# Patient Record
Sex: Female | Born: 1965 | Race: White | Hispanic: No | Marital: Married | Smoking: Current every day smoker
Health system: Southern US, Community
[De-identification: ages and names within clinical notes are randomized; demographics above are authoritative.]

## PROBLEM LIST (undated history)

## (undated) DIAGNOSIS — E119 Type 2 diabetes mellitus without complications: Secondary | ICD-10-CM

## (undated) DIAGNOSIS — E785 Hyperlipidemia, unspecified: Secondary | ICD-10-CM

## (undated) DIAGNOSIS — J449 Chronic obstructive pulmonary disease, unspecified: Secondary | ICD-10-CM

## (undated) DIAGNOSIS — K219 Gastro-esophageal reflux disease without esophagitis: Secondary | ICD-10-CM

## (undated) DIAGNOSIS — G35 Multiple sclerosis: Secondary | ICD-10-CM

## (undated) DIAGNOSIS — I10 Essential (primary) hypertension: Secondary | ICD-10-CM

## (undated) HISTORY — DX: Essential (primary) hypertension: I10

## (undated) HISTORY — PX: KIDNEY STONE SURGERY: SHX686

## (undated) HISTORY — DX: Type 2 diabetes mellitus without complications: E11.9

## (undated) HISTORY — DX: Gastro-esophageal reflux disease without esophagitis: K21.9

## (undated) HISTORY — DX: Multiple sclerosis: G35

## (undated) HISTORY — DX: Hyperlipidemia, unspecified: E78.5

---

## 2001-03-18 ENCOUNTER — Ambulatory Visit (HOSPITAL_COMMUNITY): Admission: RE | Admit: 2001-03-18 | Discharge: 2001-03-18 | Payer: Self-pay | Admitting: Obstetrics and Gynecology

## 2015-05-01 ENCOUNTER — Ambulatory Visit (INDEPENDENT_AMBULATORY_CARE_PROVIDER_SITE_OTHER): Payer: 59 | Admitting: Podiatry

## 2015-05-01 ENCOUNTER — Encounter: Payer: Self-pay | Admitting: Podiatry

## 2015-05-01 VITALS — BP 121/69 | HR 93 | Resp 18

## 2015-05-01 DIAGNOSIS — B351 Tinea unguium: Secondary | ICD-10-CM

## 2015-05-01 DIAGNOSIS — M79676 Pain in unspecified toe(s): Secondary | ICD-10-CM

## 2015-05-01 NOTE — Progress Notes (Signed)
   Subjective:    Patient ID: Abigail Lang, female    DOB: Nov 15, 1965, 49 y.o.   MRN: [id number]  HPIThis patient presents to the office with two thick disfigured big toenails both feet.  She says the toenails cause pain to her toes big toes both feet.  She has tried OTC medicine unsuccessfully to eliminate her fungus nails.  She presents for evaluation and treatment.    Review of Systems  All other systems reviewed and are negative.      Objective:   Physical Exam GENERAL APPEARANCE: Alert, conversant. Appropriately groomed. No acute distress.  VASCULAR: Pedal pulses palpable at  [facility] and PT bilateral.  Capillary refill time is immediate to all digits,  Normal temperature gradient.  Digital hair growth is present bilateral  NEUROLOGIC: sensation is normal to 5.07 monofilament at 5/5 sites bilateral.  Light touch is intact bilateral, Muscle strength normal.  MUSCULOSKELETAL: acceptable muscle strength, tone and stability bilateral.  Intrinsic muscluature intact bilateral.  Rectus appearance of foot and digits noted bilateral.   DERMATOLOGIC: skin color, texture, and turgor are within normal limits.  No preulcerative lesions or ulcers  are seen, no interdigital maceration noted.  No open lesions present. . No drainage noted. NAILS  Thick disfigured discolored great toenails both feet.  Redness and swelling at proximal nail fold.         Assessment & Plan:  Onychomycosis  Hallux B/l  IE>  Discussed conservative vs. Surgical options.  She desires for nail surgery to be scheduled in one week.

## 2015-05-08 ENCOUNTER — Encounter: Payer: Self-pay | Admitting: Podiatry

## 2015-05-08 ENCOUNTER — Ambulatory Visit (INDEPENDENT_AMBULATORY_CARE_PROVIDER_SITE_OTHER): Payer: 59 | Admitting: Podiatry

## 2015-05-08 VITALS — BP 153/85 | HR 81 | Resp 18

## 2015-05-08 DIAGNOSIS — M79676 Pain in unspecified toe(s): Secondary | ICD-10-CM | POA: Diagnosis not present

## 2015-05-08 DIAGNOSIS — B351 Tinea unguium: Secondary | ICD-10-CM

## 2015-05-08 NOTE — Progress Notes (Signed)
Subjective:     Patient ID: Abigail Lang, female   DOB: 07/28/1966, 49 y.o.   MRN: [id number]  HPIThis patient presents to the office for scheduled nail surgery for the permanent removal of left hallux nail.   Review of Systems     Objective:   Physical Exam GENERAL APPEARANCE: Alert, conversant. Appropriately groomed. No acute distress.  VASCULAR: Pedal pulses palpable at  [facility] and PT bilateral.  Capillary refill time is immediate to all digits,  Normal temperature gradient.  Digital hair growth is present bilateral  NEUROLOGIC: sensation is normal to 5.07 monofilament at 5/5 sites bilateral.  Light touch is intact bilateral, Muscle strength normal.  MUSCULOSKELETAL: acceptable muscle strength, tone and stability bilateral.  Intrinsic muscluature intact bilateral.  Rectus appearance of foot and digits noted bilateral.   DERMATOLOGIC: skin color, texture, and turgor are within normal limits.  No preulcerative lesions or ulcers  are seen, no interdigital maceration noted.  No open lesions present.  . No drainage noted. NAILS  Thick disfigured discolored nails both hallux nails.  Redness at proximal nail fold left big toe.      Assessment:     Onychomycosis     Plan:     ROV  Nail surgery for permanent removal left hallux nail. Treatment options and alternatives discussed.  Recommended permanent phenol matrixectomy and patient agreed.  Left hallux nail  was prepped with alcohol and a toe block of 3cc of 2% lidocaine plain was administered in a digital toe block. .  The toe was then prepped with betadine solution .  The  nail  was then excised and matrix tissue exposed.  Phenol was then applied to the matrix tissue followed by an alcohol wash.  Antibiotic ointment and a dry sterile dressing was applied.  The patient was dispensed instructions for aftercare. RTC 1 week.  Vicodin-ES was prescribed.

## 2015-05-15 ENCOUNTER — Encounter: Payer: Self-pay | Admitting: Podiatry

## 2015-05-15 ENCOUNTER — Ambulatory Visit (INDEPENDENT_AMBULATORY_CARE_PROVIDER_SITE_OTHER): Payer: 59 | Admitting: Podiatry

## 2015-05-15 VITALS — BP 126/77 | HR 98 | Resp 16

## 2015-05-15 DIAGNOSIS — M79676 Pain in unspecified toe(s): Secondary | ICD-10-CM

## 2015-05-15 DIAGNOSIS — B351 Tinea unguium: Secondary | ICD-10-CM

## 2015-05-15 DIAGNOSIS — Z09 Encounter for follow-up examination after completed treatment for conditions other than malignant neoplasm: Secondary | ICD-10-CM

## 2015-05-15 NOTE — Progress Notes (Signed)
Subjective:     Patient ID: Abigail Lang, female   DOB: 30-Aug-1966, 49 y.o.   MRN: [id number]  HPIThis patient presents to the office following the removal of left great toenail permanently.  SWhe says she has taken advil and soaked her foot as directed.  She has returned to her normal footgear on Monday.   Review of Systems     Objective:   Physical Exam GENERAL APPEARANCE: Alert, conversant. Appropriately groomed. No acute distress.  VASCULAR: Pedal pulses palpable at  [facility] and PT bilateral.  Capillary refill time is immediate to all digits,  Normal temperature gradient.  Digital hair growth is present bilateral  NEUROLOGIC: sensation is normal to 5.07 monofilament at 5/5 sites bilateral.  Light touch is intact bilateral, Muscle strength normal.  MUSCULOSKELETAL: acceptable muscle strength, tone and stability bilateral.  Intrinsic muscluature intact bilateral.  Rectus appearance of foot and digits noted bilateral.   DERMATOLOGIC: skin color, texture, and turgor are within normal limits.  No preulcerative lesions or ulcers  are seen, no interdigital maceration noted.  No open lesions present.  . No drainage noted. NAILS  Persistant mycotic nail right hallux.  Her left hallux nail reveals healing to the nail bed. No redness or swelling around the nail surrounding nail bed.      Assessment:     S/p Nail surgery     Plan:    ROV  Continue home soaks and bandaging.  Air dry instructions were given.

## 2015-05-22 ENCOUNTER — Encounter: Payer: Self-pay | Admitting: Podiatry

## 2015-05-22 ENCOUNTER — Ambulatory Visit (INDEPENDENT_AMBULATORY_CARE_PROVIDER_SITE_OTHER): Payer: 59 | Admitting: Podiatry

## 2015-05-22 VITALS — BP 154/92 | HR 89 | Resp 17

## 2015-05-22 DIAGNOSIS — M79676 Pain in unspecified toe(s): Secondary | ICD-10-CM

## 2015-05-22 DIAGNOSIS — B351 Tinea unguium: Secondary | ICD-10-CM | POA: Diagnosis not present

## 2015-05-22 DIAGNOSIS — Z09 Encounter for follow-up examination after completed treatment for conditions other than malignant neoplasm: Secondary | ICD-10-CM | POA: Diagnosis not present

## 2015-05-22 MED ORDER — CEPHALEXIN 500 MG PO CAPS: 500.0000 mg | ORAL_CAPSULE | Freq: Three times a day (TID) | ORAL | Status: DC

## 2015-05-22 NOTE — Progress Notes (Signed)
Subjective:     Patient ID: Abigail Lang, female   DOB: 07-Oct-1965, 49 y.o.   MRN: [id number]  HPIPatient returns to office for scheduled nail surgery for permanent removal right great toenail.  Her left great toenail is healing well and she wore her shoes in no pain.  She desires to proceed with her surgery right big toe.   Review of Systems     Objective:   Physical Exam GENERAL APPEARANCE: Alert, conversant. Appropriately groomed. No acute distress.  VASCULAR: Pedal pulses palpable at  [facility] and PT bilateral.  Capillary refill time is immediate to all digits,  Normal temperature gradient.  Digital hair growth is present bilateral  NEUROLOGIC: sensation is normal to 5.07 monofilament at 5/5 sites bilateral.  Light touch is intact bilateral, Muscle strength normal.  MUSCULOSKELETAL: acceptable muscle strength, tone and stability bilateral.  Intrinsic muscluature intact bilateral.  Rectus appearance of foot and digits noted bilateral.   DERMATOLOGIC: skin color, texture, and turgor are within normal limits.  No preulcerative lesions or ulcers  are seen, no interdigital maceration noted.  No open lesions present. . No drainage noted. NAILS  Her left hallux has moist covering which appears to be related to her neosporin usage.  Her right hallux nail is thick disfigured and discolored.     Assessment:     Onychomycosis Hallux B/L     Plan:     Nail surgery right hallux. Treatment options and alternatives discussed.  Recommended permanent phenol matrixectomy and patient agreed.  Right hallux  was prepped with alcohol and a toe block of 3cc of 2% lidocaine plain was administered in a digital toe block. .  The toe was then prepped with betadine solution .  The complete  nail  was then excised and matrix tissue exposed.  Phenol was then applied to the matrix tissue followed by an alcohol wash.  Antibiotic ointment and a dry sterile dressing was applied.  The patient was dispensed instructions for  aftercare.   RTC 1 week. Cephalexin 500 mg sent to pharmacy.  Vicodin-ES was prescribed # 20.

## 2015-05-29 ENCOUNTER — Encounter: Payer: Self-pay | Admitting: Podiatry

## 2015-05-29 ENCOUNTER — Ambulatory Visit (INDEPENDENT_AMBULATORY_CARE_PROVIDER_SITE_OTHER): Payer: 59 | Admitting: Podiatry

## 2015-05-29 DIAGNOSIS — Z09 Encounter for follow-up examination after completed treatment for conditions other than malignant neoplasm: Secondary | ICD-10-CM

## 2015-05-29 NOTE — Progress Notes (Signed)
Subjective:     Patient ID: Abigail Lang, female   DOB: [birth year], 49 y.o.   MRN: [id number]  HPIThis patient presents to the office following nail removal both hallux.  Her left big toenail was removed three weeks ago.  It is healing with no drainage but has not completely healed.  Her right big toenail area has drainage.  She is soaking and bandaging her right big toenail.  She is taking antibiotics.   Review of Systems     Objective:   Physical Exam GENERAL APPEARANCE: Alert, conversant. Appropriately groomed. No acute distress.  VASCULAR: Pedal pulses palpable at  [facility] and PT bilateral.  Capillary refill time is immediate to all digits,  Normal temperature gradient.  Digital hair growth is present bilateral  NEUROLOGIC: sensation is normal to 5.07 monofilament at 5/5 sites bilateral.  Light touch is intact bilateral, Muscle strength normal.  MUSCULOSKELETAL: acceptable muscle strength, tone and stability bilateral.  Intrinsic muscluature intact bilateral.  Rectus appearance of foot and digits noted bilateral.   DERMATOLOGIC: skin color, texture, and turgor are within normal limits.  No preulcerative lesions or ulcers  are seen, no interdigital maceration noted.  No open lesions present.  Digital nails are asymptomatic. No drainage noted. NAILS  Her left big toe is healing with callus cover developing with no drainage or infection.  Her right big toe is healing at nail bed but redness and swelling present at proximal nail fold.     Assessment:     S/p nail surgery     Plan:     ROV  Home instructions given.  RTC 3 weeks.

## 2015-06-19 ENCOUNTER — Ambulatory Visit: Payer: 59 | Admitting: Sports Medicine

## 2020-09-03 ENCOUNTER — Other Ambulatory Visit: Payer: Self-pay | Admitting: Family Medicine

## 2020-09-03 ENCOUNTER — Other Ambulatory Visit (HOSPITAL_COMMUNITY)
Admission: RE | Admit: 2020-09-03 | Discharge: 2020-09-03 | Disposition: A | Discharge status: Home / Self Care | Payer: 59 | Source: Ambulatory Visit | Attending: Family Medicine | Admitting: Family Medicine

## 2020-09-03 DIAGNOSIS — Z124 Encounter for screening for malignant neoplasm of cervix: Secondary | ICD-10-CM | POA: Diagnosis present

## 2020-09-03 DIAGNOSIS — R1012 Left upper quadrant pain: Secondary | ICD-10-CM

## 2020-09-09 LAB — CYTOLOGY - PAP
Comment: NEGATIVE
Diagnosis: NEGATIVE
High risk HPV: NEGATIVE

## 2020-09-23 ENCOUNTER — Other Ambulatory Visit: Payer: Self-pay | Admitting: Family Medicine

## 2020-09-23 DIAGNOSIS — R1012 Left upper quadrant pain: Secondary | ICD-10-CM

## 2020-10-15 ENCOUNTER — Other Ambulatory Visit: Payer: 59

## 2020-10-22 ENCOUNTER — Ambulatory Visit
Admission: RE | Admit: 2020-10-22 | Discharge: 2020-10-22 | Disposition: A | Discharge status: Home / Self Care | Payer: 59 | Source: Ambulatory Visit | Attending: Family Medicine | Admitting: Family Medicine

## 2020-10-22 ENCOUNTER — Other Ambulatory Visit: Payer: Self-pay | Admitting: Family Medicine

## 2020-10-22 DIAGNOSIS — R059 Cough, unspecified: Secondary | ICD-10-CM

## 2020-10-29 ENCOUNTER — Encounter: Payer: Self-pay | Admitting: Dietician

## 2020-10-29 ENCOUNTER — Other Ambulatory Visit: Payer: Self-pay

## 2020-10-29 ENCOUNTER — Encounter: Payer: 59 | Attending: Family Medicine | Admitting: Dietician

## 2020-10-29 DIAGNOSIS — E119 Type 2 diabetes mellitus without complications: Secondary | ICD-10-CM | POA: Insufficient documentation

## 2020-10-29 NOTE — Patient Instructions (Addendum)
Consider a vitamin D supplement and a B complex vitamin Consider the Calorie [employer] P[employer] app to help with informed decisions.  Aim for 3 Carb Choices per meal (45 grams) +/- 1 either way  Aim for 0-1 Carbs per snack if hungry  Include protein in moderation with your meals and snacks Consider reading food labels for Total Carbohydrate of foods Consider  increasing your activity level by walking for 30 minutes daily as tolerated Consider checking BG at alternate times per day  Continue taking medication as directed by MD

## 2020-10-29 NOTE — Progress Notes (Signed)
Diabetes Self-Management Education  Visit Type: First/Initial  Appt. Start Time: 1000  Appt. End Time: 1130  10/30/2020  [name], identified by name and date of birth, is a 55 y.o. female with a diagnosis of Diabetes: Type 2.   ASSESSMENT Patient is here today alone.  She has newly diagnosed type 2 diabetes.  She was also referred for HTN and HLD.    Other history includes:  MS, GERD, kidney stones, and GDM.  She is having nausea, abdominal pain, and bloating.  This is being worked up. Labe:  A1C 8.2, cholesterol 208, triglycerides 178, HDL 42, LDL 135 09/2020 Sleep:  6.5-7 hours of interrupted sleep  (due to frequent urination) Medications include:  Metformin, 200 mg caffeine bid  Patient lives with her mother to help care for her (she has lung cancer, severe COPD, and falls frequently). Her boyfried works 3rd shift and stays with her mom during the day.  Her mom likes a lot of sweets but this is not tempting to patient.  [place] likes increased bread (changed to [employer]), ice cream (changed to frozen yogurt), and cereal and whole milk (changed to honey nut cheerios from sugar smacks etc).  She loves salt. She used to skip breakfast and lunch and has now added lunch. She recently ordered 3 cook books for cooking with diabetes. Patient smokes and she wants to quit smoking but thinks that she cannot currently due to increased stress. Patient does the shopping and cooking.  Her mom likes to get take out frequently. 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 She is currently not exercising although able.  She is thinking of joining a gym or finding another option to walk during lunch.  Height _0  (1.626 m), weight 221 lb (100.2 kg). Body mass index is 37.93 kg/m.   Diabetes Self-Management Education - 10/29/20 1019      Visit Information   Visit Type First/Initial      Initial Visit   Diabetes Type Type 2    Are you currently  following a meal plan? No    Are you taking your medications as prescribed? Yes    Date Diagnosed 08/2020      Health Coping   How would you rate your overall health? Fair      Psychosocial Assessment   Patient Belief/Attitude about Diabetes Motivated to manage diabetes    Self-care barriers None    Self-management support Doctor's office    Other persons present Patient    Patient Concerns Nutrition/Meal planning;Glycemic Control;Weight Control    Special Needs None    Preferred Learning Style No preference indicated    Learning Readiness Ready    How often do you need to have someone help you when you read instructions, pamphlets, or other written materials from your doctor or pharmacy? 1 - Never    What is the last grade level you completed in school? some college      Pre-Education Assessment   Patient understands the diabetes disease and treatment process. Needs Instruction    Patient understands incorporating nutritional management into lifestyle. Needs Instruction    Patient undertands incorporating physical activity into lifestyle. Needs Instruction    Patient understands using medications safely. Needs Instruction    Patient understands monitoring blood glucose, interpreting and using results Needs Instruction    Patient understands prevention, detection, and treatment of acute complications. Needs Instruction    Patient understands prevention, detection, and  treatment of chronic complications. Needs Instruction    Patient understands how to develop strategies to address psychosocial issues. Needs Instruction    Patient understands how to develop strategies to promote health/change behavior. Needs Instruction      Complications   Last HgB A1C per patient/outside source 8.2 %   09/03/2021   How often do you check your blood sugar? 3-4 times / week    Fasting Blood glucose range (mg/dL) 70-129;130-179    Postprandial Blood glucose range (mg/dL) 130-179;180-200    Number of  hypoglycemic episodes per month 0    Number of hyperglycemic episodes per week 2    Can you tell when your blood sugar is high? Yes    What do you do if your blood sugar is high? drinks a lot of water    Have you had a dilated eye exam in the past 12 months? No    Have you had a dental exam in the past 12 months? No    Are you checking your feet? Yes    How many days per week are you checking your feet? 7      Dietary Intake   Breakfast nothing (due to nausea)    Snack (morning) activia, occasional fruit    Lunch Healthy Choice Meal OR other healthy frozen meal, occasional fruit    Snack (afternoon) fruit    Dinner ribs, broccoli, salad OR 5 wings, pizza OR [employer] chicken, potatoes, mac and cheese, slaw    Snack (evening) frozen yogurt    Beverage(s) water, crystal lite, occasional coffee with splenda, cream      Exercise   Exercise Type ADL's    How many days per week to you exercise? 0    How many minutes per day do you exercise? 0    Total minutes per week of exercise 0      Patient Education   Previous Diabetes Education No    Disease state  Definition of diabetes, type 1 and 2, and the diagnosis of diabetes;Factors that contribute to the development of diabetes    Nutrition management  Role of diet in the treatment of diabetes and the relationship between the three main macronutrients and blood glucose level;Food label reading, portion sizes and measuring food.;Carbohydrate counting;Information on hints to eating out and maintain blood glucose control.;Meal options for control of blood glucose level and chronic complications.    Physical activity and exercise  Role of exercise on diabetes management, blood pressure control and cardiac health.    Medications Reviewed patients medication for diabetes, action, purpose, timing of dose and side effects.    Monitoring Purpose and frequency of SMBG.;Identified appropriate SMBG and/or A1C goals.;Daily foot exams;Yearly dilated eye exam     Acute complications Taught treatment of hypoglycemia - the 15 rule.;Discussed and identified patients' treatment of hyperglycemia.    Chronic complications Relationship between chronic complications and blood glucose control;Dental care;Retinopathy and reason for yearly dilated eye exams;Assessed and discussed foot care and prevention of foot problems    Personal strategies to promote health Lifestyle issues that need to be addressed for better diabetes care      Individualized Goals (developed by patient)   Nutrition General guidelines for healthy choices and portions discussed    Physical Activity Exercise 5-7 days per week;30 minutes per day    Medications take my medication as prescribed    Monitoring  test my blood glucose as discussed    Reducing Risk examine blood glucose patterns;do foot checks  daily;increase portions of healthy fats    Health Coping discuss diabetes with (comment)   MD, RD, CDCES     Post-Education Assessment   Patient understands the diabetes disease and treatment process. Demonstrates understanding / competency    Patient understands incorporating nutritional management into lifestyle. Needs Review    Patient undertands incorporating physical activity into lifestyle. Needs Review    Patient understands using medications safely. Demonstrates understanding / competency    Patient understands monitoring blood glucose, interpreting and using results Needs Review    Patient understands prevention, detection, and treatment of acute complications. Demonstrates understanding / competency    Patient understands prevention, detection, and treatment of chronic complications. Demonstrates understanding / competency    Patient understands how to develop strategies to address psychosocial issues. Needs Review    Patient understands how to develop strategies to promote health/change behavior. Needs Review      Outcomes   Expected Outcomes Demonstrated interest in learning. Expect  positive outcomes    Future DMSE 2 months    Program Status Not Completed           Individualized Plan for Diabetes Self-Management Training:   Learning Objective:  Patient will have a greater understanding of diabetes self-management. Patient education plan is to attend individual and/or group sessions per assessed needs and concerns.   Plan:   Patient Instructions  Consider a vitamin D supplement and a B complex vitamin Consider the Calorie [employer] Pace app to help with informed decisions.  Aim for 3 Carb Choices per meal (45 grams) +/- 1 either way  Aim for 0-1 Carbs per snack if hungry  Include protein in moderation with your meals and snacks Consider reading food labels for Total Carbohydrate of foods Consider  increasing your activity level by walking for 30 minutes daily as tolerated Consider checking BG at alternate times per day  Continue taking medication as directed by MD      Expected Outcomes:  Demonstrated interest in learning. Expect positive outcomes  Education material provided: ADA - How to Thrive: A Guide for Your Journey with Diabetes, Food label handouts, Meal plan card, Snack sheet and Diabetes Resources. Dining out tips  If problems or questions, patient to contact team via:  Phone  Future DSME appointment: 2 months

## 2020-10-31 ENCOUNTER — Ambulatory Visit: Payer: 59 | Admitting: Dietician

## 2020-11-04 ENCOUNTER — Ambulatory Visit
Admission: RE | Admit: 2020-11-04 | Discharge: 2020-11-04 | Disposition: A | Discharge status: Home / Self Care | Payer: 59 | Source: Ambulatory Visit | Attending: Family Medicine | Admitting: Family Medicine

## 2020-11-04 ENCOUNTER — Other Ambulatory Visit: Payer: Self-pay

## 2020-11-04 DIAGNOSIS — R1012 Left upper quadrant pain: Secondary | ICD-10-CM

## 2020-11-04 MED ORDER — IOPAMIDOL (ISOVUE-300) INJECTION 61%
100.0000 mL | Freq: Once | INTRAVENOUS | Status: AC | PRN
  Administered 2020-11-04: 100 mL via INTRAVENOUS

## 2020-11-19 ENCOUNTER — Telehealth: Payer: Self-pay | Admitting: *Deleted

## 2020-11-19 ENCOUNTER — Encounter: Payer: Self-pay | Admitting: Neurology

## 2020-11-19 ENCOUNTER — Ambulatory Visit: Payer: 59 | Admitting: Neurology

## 2020-11-19 VITALS — BP 133/86 | HR 83 | Ht 64.0 in | Wt 216.0 lb

## 2020-11-19 DIAGNOSIS — R5383 Other fatigue: Secondary | ICD-10-CM

## 2020-11-19 DIAGNOSIS — R3915 Urgency of urination: Secondary | ICD-10-CM | POA: Diagnosis not present

## 2020-11-19 DIAGNOSIS — G35 Multiple sclerosis: Secondary | ICD-10-CM

## 2020-11-19 DIAGNOSIS — H469 Unspecified optic neuritis: Secondary | ICD-10-CM

## 2020-11-19 DIAGNOSIS — R269 Unspecified abnormalities of gait and mobility: Secondary | ICD-10-CM

## 2020-11-19 MED ORDER — MODAFINIL 200 MG PO TABS: 200.0000 mg | ORAL_TABLET | Freq: Every day | ORAL | 5 refills | Status: DC

## 2020-11-19 MED ORDER — TOLTERODINE TARTRATE ER 4 MG PO CP24: 4.0000 mg | ORAL_CAPSULE | Freq: Every day | ORAL | 11 refills | Status: DC

## 2020-11-19 NOTE — Progress Notes (Addendum)
[facility]  PATIENT: Abigail Lang DOB: November 08, 1965  REFERRING DOCTOR OR PCP: Harlan Stains MD SOURCE: Patient, notes from Dr. Dema Severin, note from Dr. Vernona Rieger, imaging and EMG and lab results, MRI images personally reviewed  _________________________________   HISTORICAL  CHIEF COMPLAINT:  Chief Complaint  Patient presents with   New Patient (Initial Visit)    RM 13, alone. Paper referral from Sheliah Hatch for MS. Previously treated in [facility]. Dx w/ MS at age 42 by Dr. Evette Doffing. Transitioned to Dr. Erling Cruz. Has not seen neurologist in about 7 yr. Tried/failed: Tysabri (ended up being JCV +), Betaseron (made her weak). Avonex: "made leg muscles feel like they were ripping". Takes Vavarin tabs twice daily (caffeine pills). Last MRI >5 yr ago.    HISTORY OF PRESENT ILLNESS:  I had the pleasure of seeing your patient, Abigail Lang, at [facility] for neurologic consultation regarding her multiple sclerosis.  She is a 55 year old woman who was diagnosed with MS at age 59 (35) after presenting with numbness in her right face,   Over time, symptoms improved.   She had an MRI and a lumbar puncture at the time and was diagnosed with MS (Dr. Johnnye Sima and then Dr. Erling Cruz).   She was not placed on treatment.   She had right optic neuritis 2010 or 2011.   She had some symptoms off/on over the next 10 years, but none enough to seek medical attention.   She was treated with IV Solu-medrol and she got full return of her vision.    She saw Dr. Vernona Rieger and was placed on Avonex.   She had severe myalgias and stopped after a year.   She then tried IV Tysabri and was on that x 2-3 years.   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    She then tried Betaseron a short while in 2013 (expected to be well but had tolerability issues and then lost her insurance.   She last saw Dr. Vernona Rieger around that time.    In general, she has done well  between 2014 and 2022 and just had some mild transitory symptoms.    Her last MRI was in 2012.  Currently, she notes her balance is slightly off but could walk > 1 mile without stopping.  The right foot will drag when she goes longer distance.   She deneies any significant numbness.   She has urge incontinence.    She has nocturia several times a night.     She feels vision is mildly off.  She notes reduced color vision OS.   She has a lot of fatigue.   She takes caffeine pills with transient benefit.   She was once on Provigil with benefit but has not used it > 8 years.    She notes some depression but not anxiety.   She has increased stres with her mom having dementia and cancer and just breakingup with a long term boyfriend.   She notes cognitive issues such as word finding difficulty and mild reduced memory and decreased focus/attention.       Data reviewed today MRI of the brain 05/26/2011 shows T2/FLAIR hyperintense foci in the periventricular, juxtacortical and deep white matter of the hemispheres.  Additionally there is a focus in the left middle cerebellar peduncle.  None of the foci enhance after contrast.  There is a 1.2 cm dural based enhancing focus most consistent with a falx meningioma.  No  changes compared to the 2010 MRI.  MRI of the brain 05/16/2009 shows T2/FLAIR hyperintense foci in the periventricular, juxtacortical and deep white matter of both hemispheres.  There is a focus in the left middle cerebellar peduncle.  The foci do not enhance after contrast.  There is a 1.2 mm homogenously enhancing dural based focus consistent with a meningioma of the falx.  EMG/NCV 03/13/2010 showed severe right and mild left carpal tunnel syndromes  REVIEW OF SYSTEMS: Constitutional: No fevers, chills, sweats, or change in appetite.  Fatigue Eyes: No visual changes, double vision, eye pain.  History of optic neuritis Ear, nose and throat: No hearing loss, ear pain, nasal congestion, sore  throat Cardiovascular: No chest pain, palpitations Respiratory:  No shortness of breath at rest or with exertion.   No wheezes GastrointestinaI: No nausea, vomiting, diarrhea, abdominal pain, fecal incontinence Genitourinary:   urinary urgency. Musculoskeletal:  No neck pain, back pain Integumentary: No rash, pruritus, skin lesions Neurological: as above Psychiatric: She has some depression and anxiety Endocrine: No palpitations, diaphoresis, change in appetite, change in weigh or increased thirst Hematologic/Lymphatic:  No anemia, purpura, petechiae. Allergic/Immunologic: No itchy/runny eyes, nasal congestion, recent allergic reactions, rashes  ALLERGIES: No Known Allergies  HOME MEDICATIONS:  Current Outpatient Medications:    caffeine 200 MG TABS tablet, Take 200 mg by mouth 2 (two) times daily as needed., Disp: , Rfl:    losartan (COZAAR) 50 MG tablet, Take 50 mg by mouth daily., Disp: , Rfl:    metFORMIN (GLUCOPHAGE) 1000 MG tablet, Take 2,000 mg by mouth every evening., Disp: , Rfl:    omeprazole (PRILOSEC) 40 MG capsule, Take 40 mg by mouth daily., Disp: , Rfl:   PAST MEDICAL HISTORY: Past Medical History:  Diagnosis Date   Diabetes mellitus without complication (HCC)    GERD (gastroesophageal reflux disease)    Hyperlipidemia    Hypertension    MS (multiple sclerosis) ([place])     PAST SURGICAL HISTORY: Past Surgical History:  Procedure Laterality Date   CHOLECYSTECTOMY     LAPAROSCOPIC OVARIAN     TUBAL LIGATION      FAMILY HISTORY: Family History  Problem Relation Age of Onset   COPD Mother    Lung cancer Mother    Hypertension Mother    High Cholesterol Mother    Heart attack Father    High Cholesterol Father    Hypertension Father    Diabetes Father     SOCIAL HISTORY:  Social History   Socioeconomic History   Marital status: Married    Spouse name: Not on file   Number of children: Not on file   Years of education: Not on file   Highest  education level: Not on file  Occupational History   Not on file  Tobacco Use   Smoking status: Current Every Day Smoker    Packs/day: 1.00   Smokeless tobacco: Never Used  Substance and Sexual Activity   Alcohol use: Not Currently    Alcohol/week: 0.0 standard drinks   Drug use: Never   Sexual activity: Not on file  Other Topics Concern   Not on file  Social History Narrative   Right handed   Lives w/ mother   Caffeine JHE:RDEYCX, soda once a week   Social Determinants of Health   Financial Resource Strain: Not on file  Food Insecurity: Not on file  Transportation Needs: Not on file  Physical Activity: Not on file  Stress: Not on file  Social Connections: Not on  file  Intimate Partner Violence: Not on file     PHYSICAL EXAM  Vitals:   11/19/20 1311  BP: 133/86  Pulse: 83  Weight: 216 lb (98 kg)  Height: 5\' 4"  (1.626 m)    Body mass index is 37.08 kg/m.   Hearing Screening   125Hz  250Hz  500Hz  1000Hz  2000Hz  3000Hz  4000Hz  6000Hz  8000Hz   Right ear:           Left ear:             Visual Acuity Screening   Right eye Left eye Both eyes  Without correction: 20/100 20/100 20/70  With correction:       General: The patient is well-developed and well-nourished and in no acute distress  HEENT:  Head is Robertson/AT.  Sclera are anicteric.  Funduscopic exam shows slight left optic nerve pallor  Neck: No carotid bruits are noted.  The neck is nontender.  Cardiovascular: The heart has a regular rate and rhythm with a normal S1 and S2. There were no murmurs, gallops or rubs.    Skin: Extremities are without rash or  edema.  Musculoskeletal:  Back is nontender  Neurologic Exam  Mental status: The patient is alert and oriented x 3 at the time of the examination. The patient has apparent normal recent and remote memory, with an apparently normal attention span and concentration ability.   Speech is normal.  Cranial nerves: Extraocular movements are full.  1+ left APD.   Visual fields are full.  Facial symmetry is present. There is good facial sensation to soft touch bilaterally.Facial strength is normal.  Trapezius and sternocleidomastoid strength is normal. No dysarthria is noted.  The tongue is midline, and the patient has symmetric elevation of the soft palate. No obvious hearing deficits are noted.  Motor:  Muscle bulk is normal.   Tone is normal. Strength is  5 / 5 in all 4 extremities except 4+/5 right APB.   Sensory: Sensory testing is intact to pinprick, soft touch and vibration sensation in all 4 extremities.  She did not have a Tinel's sign.  Coordination: Cerebellar testing reveals good finger-nose-finger and heel-to-shin bilaterally.  Gait and station: Station is normal.   Gait is normal. Tandem gait is mildly wide.  . Romberg is negative.   Reflexes: Deep tendon reflexes are symmetric and normal bilaterally.   Plantar responses are flexor.      ASSESSMENT AND PLAN  Multiple sclerosis ([place]) - Plan: MR BRAIN W WO CONTRAST, MR CERVICAL SPINE W WO CONTRAST  Optic neuritis  Other fatigue  Urinary urgency  Gait disturbance - Plan: MR BRAIN W WO CONTRAST, MR CERVICAL SPINE W WO CONTRAST   In summary, Abigail Lang is a 55 year old woman who was diagnosed with MS about 35 years ago.  Her last definite exacerbation was around 2010-2011 when she had optic neuritis..  She has not been on any disease modifying therapy since 2013.  MRI was stable between 2010 and 2012 but she has not had any further imaging studies.  She most likely has a milder than typical multiple sclerosis.  We discussed that we need to obtain another MRI of the brain to compare with the one from 2012.  We will also check MRI of the cervical spine.  Most likely she has had some progression.  If we see progression I would definitely encourage her to begin a disease modifying therapy.  Mavenclad might be a good option as it would improve compliance..  Her main symptoms  currently are  fatigue and bladder dysfunction.  She did very well with Provigil in the past so I will reinitiate modafinil 200 mg p.o. every morning.  Additionally I will start Detrol LA 4 mg.  She also has carpal tunnel on the right.  She feels symptoms have stabilized and she prefers not to do any surgery at this time.  She will return to see me in 3 months but we will let her know the results of the MRI and bring her in sooner if we need to check blood work to begin a disease modifying therapy.  Thank you for asking me to see Abigail Lang.  Please let me know if I can be of further assistance with her or other patients in the future.   Jolie Lang A. Felecia Shelling, MD, [facility] 04/21/7194, 9:74 PM Certified in Neurology, Clinical Neurophysiology, Sleep Medicine and Neuroimaging  [facility] [address] [place], [place] [address] [phone]

## 2020-11-19 NOTE — Telephone Encounter (Signed)
Tried initiating PA modafinil on CMM. Key:[id number]. Unable to locate pt.  LVM for pt to call and verify info on file before proceeding further w/ PA

## 2020-11-20 ENCOUNTER — Telehealth: Payer: Self-pay | Admitting: Neurology

## 2020-11-20 NOTE — Telephone Encounter (Signed)
11/20/20 LVM for pt to call back to schedule [facility] 11/20/20 [employer] auth: [id number] (exp. 11/20/20 to 01/04/21) EE

## 2020-11-20 NOTE — Telephone Encounter (Signed)
Called pt and informed her modafinil approved, she can now pick up rx from pharmacy. She verbalized understanding.

## 2020-11-20 NOTE — Telephone Encounter (Signed)
I tried submitting a new PA but still could not locate pt. I called pt. Confirmed name spelled correct/DOB correct. Confirmed address. She thought it may be associated with [address] [place], [place] [address]. I tried this and still unable to locate her by name/DOB. She is going to contact her insurance and call us back w/ update.

## 2020-11-20 NOTE — Telephone Encounter (Signed)
Late entry from 11/19/20: Pt called back and spoke with Jael in phone room. Reported insurance has old address on file: [address] [place], [place] [address]. She has to get this updated w/ them.

## 2020-11-20 NOTE — Telephone Encounter (Addendum)
Per Jael, pt called back and provided the following phone# to call to do PA: [phone]. Last 4 of social per pt: [phone].  Called and spoke with [facility]. ref# for call: [id number]. She states I had the incorrect department. I need to call: [phone] (pharmacy team). Called and spoke with Kennyth Lose. Provided PA info over the phone and received approval info: approved 11-21-20-2021-11-21. [id number].   ICD 10 codes: R53.83 (fatigue), G35 (MS)

## 2020-11-26 ENCOUNTER — Ambulatory Visit: Payer: 59 | Admitting: Neurology

## 2020-11-27 ENCOUNTER — Other Ambulatory Visit: Payer: Self-pay | Admitting: *Deleted

## 2020-11-27 MED ORDER — OXYBUTYNIN CHLORIDE ER 10 MG PO TB24: 10.0000 mg | ORAL_TABLET | Freq: Every day | ORAL | 11 refills | Status: AC

## 2020-11-27 NOTE — Progress Notes (Signed)
Called pt. Made her aware pharmacy reached out to let us know tolerodine 4mg  24hr cap not covered by insurance. Alternatives insurance will cover: oxybutynin, oxybutynin ER or Toviaz. Per MD, he would like to switch her to Oxybutynin ER 10mg  daily #30, 11 refills. Pt agreeable to plan. I e-scribed rx to pharmacy.

## 2020-12-24 ENCOUNTER — Ambulatory Visit: Payer: 59 | Admitting: Dietician

## 2021-03-11 ENCOUNTER — Ambulatory Visit: Payer: 59 | Admitting: Neurology

## 2021-06-04 ENCOUNTER — Other Ambulatory Visit: Payer: Self-pay | Admitting: Neurology

## 2021-06-05 ENCOUNTER — Other Ambulatory Visit: Payer: Self-pay | Admitting: Neurology

## 2021-06-05 NOTE — Telephone Encounter (Signed)
Pt called requesting refill for modafinil (PROVIGIL) 200 MG tablet. [place] [phone].

## 2021-06-09 ENCOUNTER — Telehealth: Payer: Self-pay | Admitting: Neurology

## 2021-06-09 MED ORDER — MODAFINIL 200 MG PO TABS
200.0000 mg | ORAL_TABLET | Freq: Every day | ORAL | 5 refills | Status: AC
Start: 2021-06-09 — End: ?

## 2021-06-09 NOTE — Telephone Encounter (Signed)
Pt scheduled a follow appt. Requesting refill for modafinil (PROVIGIL) 200 MG tablet. [place] [phone].

## 2021-06-09 NOTE — Telephone Encounter (Signed)
[employer] Josem Kaufmann: [id number] & [phone] (exp. 06/09/21 to 07/24/21)  Left a voicemail for pt to call back to schedule.

## 2021-06-09 NOTE — Addendum Note (Signed)
Addended by: Darleen Crocker on: 06/09/2021 04:02 PM   Modules accepted: Orders

## 2021-06-09 NOTE — Telephone Encounter (Signed)
Patient returned my call she is scheduled at [facility] for 06/10/21.

## 2021-06-10 ENCOUNTER — Ambulatory Visit (INDEPENDENT_AMBULATORY_CARE_PROVIDER_SITE_OTHER): Payer: 59

## 2021-06-10 ENCOUNTER — Other Ambulatory Visit: Payer: Self-pay

## 2021-06-10 DIAGNOSIS — G35 Multiple sclerosis: Secondary | ICD-10-CM | POA: Diagnosis not present

## 2021-06-10 DIAGNOSIS — R269 Unspecified abnormalities of gait and mobility: Secondary | ICD-10-CM

## 2021-06-10 MED ORDER — GADOBENATE DIMEGLUMINE 529 MG/ML IV SOLN
20.0000 mL | Freq: Once | INTRAVENOUS | Status: AC | PRN
  Administered 2021-06-10: 20 mL via INTRAVENOUS

## 2021-06-11 ENCOUNTER — Telehealth: Payer: Self-pay | Admitting: Neurology

## 2021-06-11 DIAGNOSIS — D329 Benign neoplasm of meninges, unspecified: Secondary | ICD-10-CM

## 2021-06-11 NOTE — Telephone Encounter (Signed)
I tried to reach Abigail Lang about the MRI results.  I left a voicemail message.  The MRI of the brain shows some T2/flair hyperintense foci in a pattern consistent with chronic demyelinating plaque associated with multiple sclerosis.  Compared to the 2012 MRI, there did not appear to be any new lesions.  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  It measures 24 x 21 mm and is greatly increased in size compared to the meningioma that was present on the 2012 MRI.  MRI of the cervical spine shows some degenerative changes.  There were no demyelinating plaques of the spinal cord.  I will going to discuss with her that the MS seems to be stable.  However, because the meningioma has significantly increased in size I would like her to have a neurosurgical evaluation.  Since the changes are over a fairly long period time watchful waiting may make more sense.  However, since it has increased quite a bit compared to 2012 I would like an additional opinion.

## 2021-06-12 NOTE — Telephone Encounter (Signed)
I discussed the MRIs (see rest of note)  We will consult NSU to help determine watchful waiting vs. Surgery.

## 2021-06-13 ENCOUNTER — Telehealth: Payer: Self-pay | Admitting: Neurology

## 2021-06-13 NOTE — Telephone Encounter (Signed)
Sent to [facility] ph # [phone]. 

## 2021-07-23 IMAGING — CT CT ABD-PELV W/ CM
1 of 3 series · 13 of 32 positions shown, 18 images · IV contrast (APPLIED)
Comparison: None.

CLINICAL DATA: Left upper abdominal pain for 2 months.

EXAM:
CT ABDOMEN AND PELVIS WITH CONTRAST
TECHNIQUE: Multidetector CT imaging of the abdomen and pelvis was performed
using the standard protocol following bolus administration of
intravenous contrast.
CONTRAST:  100mL [id number] IOPAMIDOL (0115XN-CZZ) INJECTION 61%
Creatinine was obtained on site at [HOSPITAL] at [HOSPITAL].
Results: Creatinine 0.8 mg/dL.

[Series 2: abd/pelvis w/cm · axial · 0.92mm/px · z∈[-522,-87]mm · 13 of 101 slices shown, 18 images]
[im 7/101  soft-tissue]
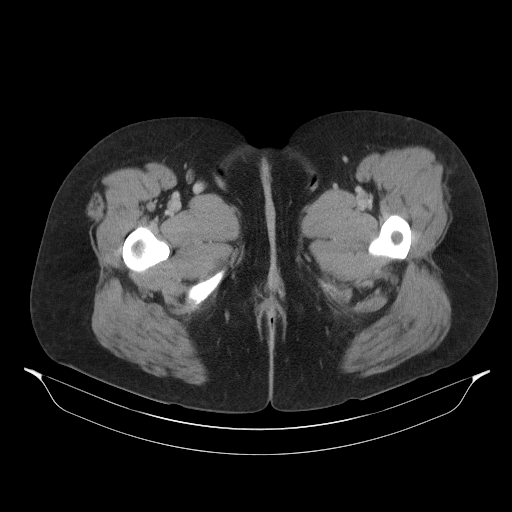
[im 7/101  bone]
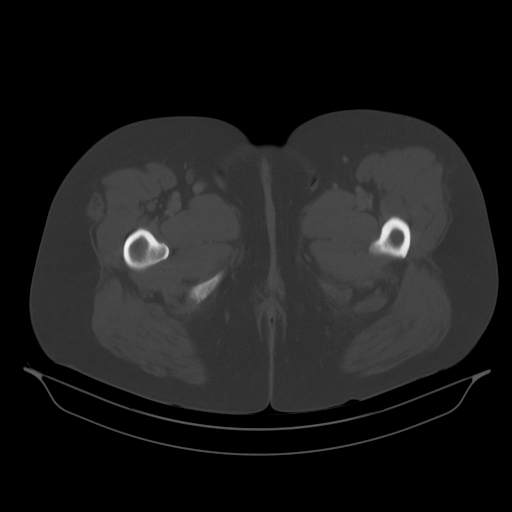
[im 13/101  soft-tissue]
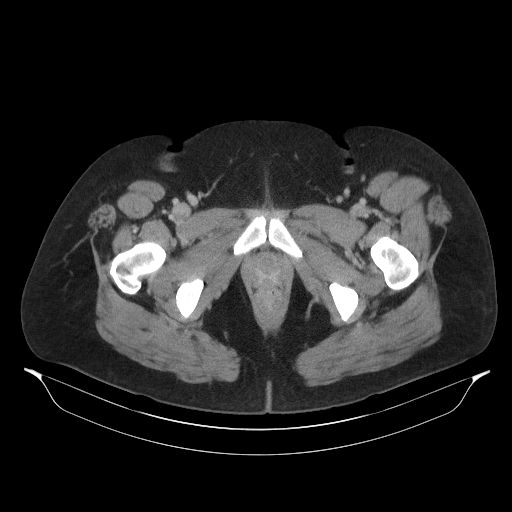
[im 26/101  soft-tissue]
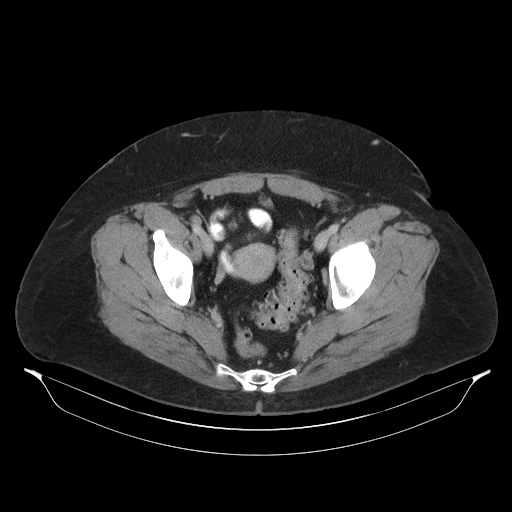
[im 32/101  soft-tissue]
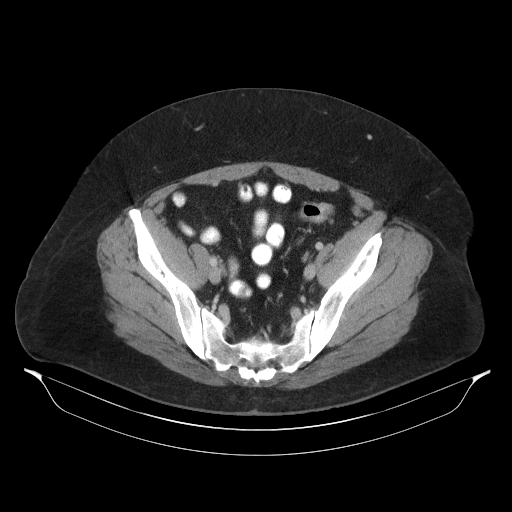
[im 38/101  soft-tissue]
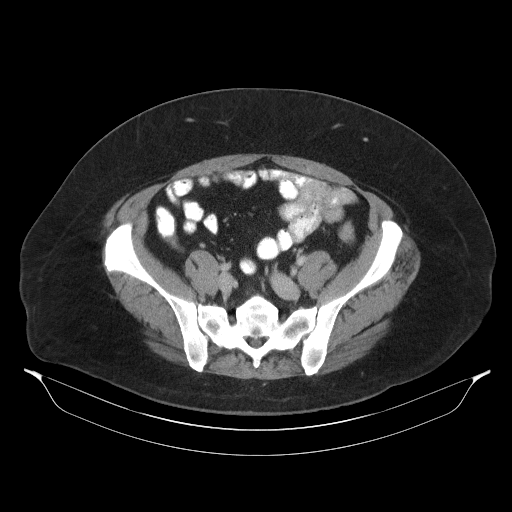
[im 44/101  soft-tissue]
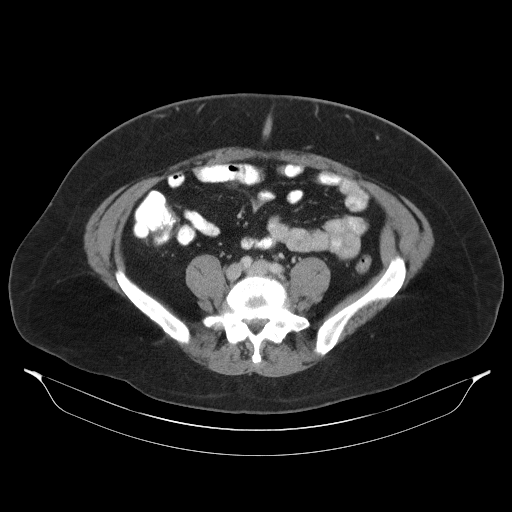
[im 57/101  soft-tissue]
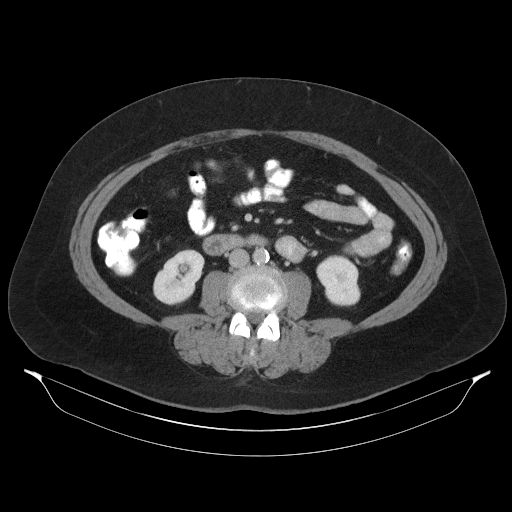
[im 63/101  soft-tissue]
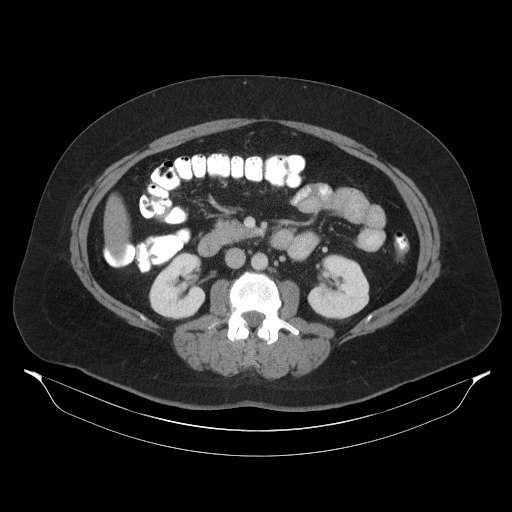
[im 69/101  soft-tissue]
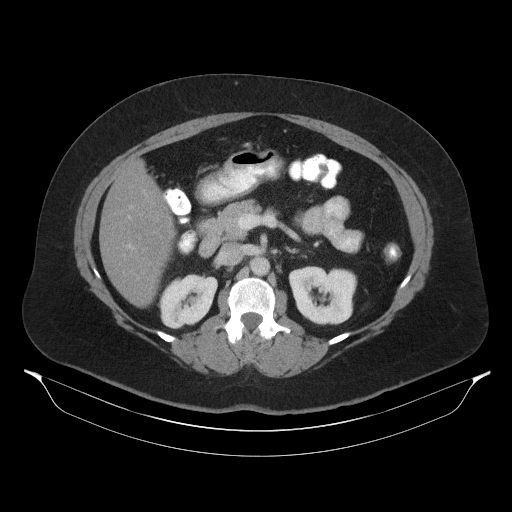
[im 69/101  bone]
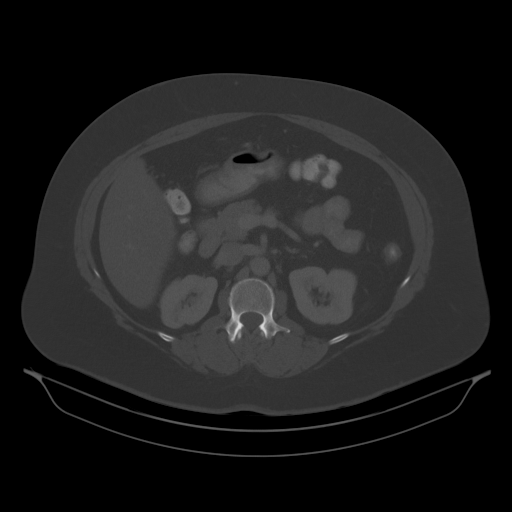
[im 76/101  soft-tissue]
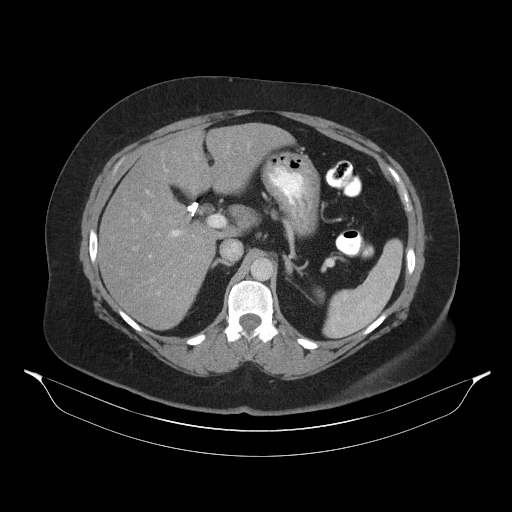
[im 76/101  lung]
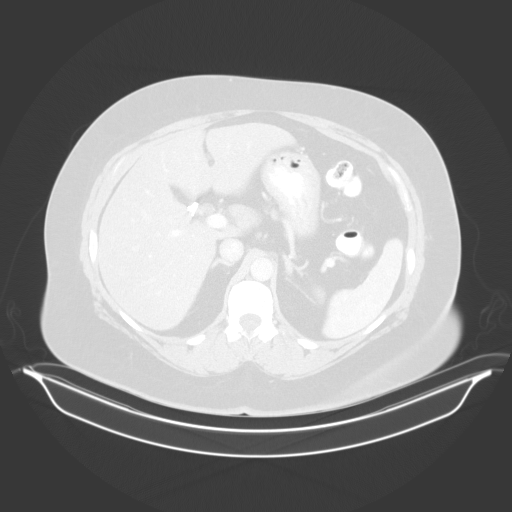
[im 82/101  lung]
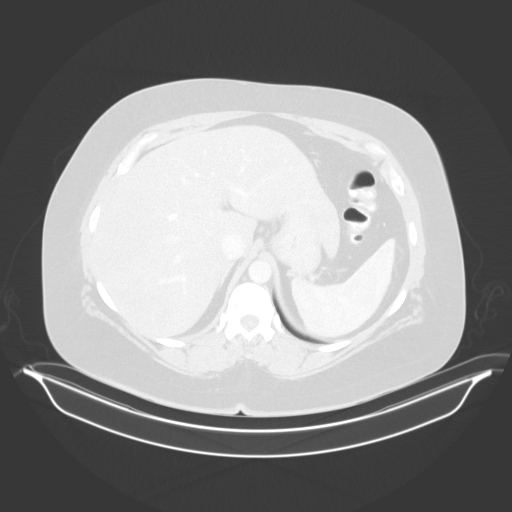
[im 88/101  soft-tissue]
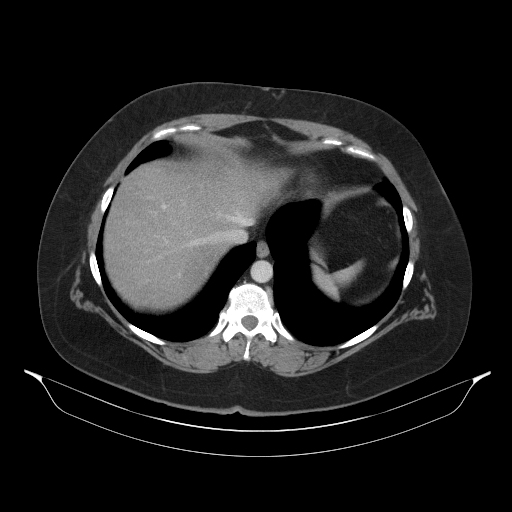
[im 88/101  lung]
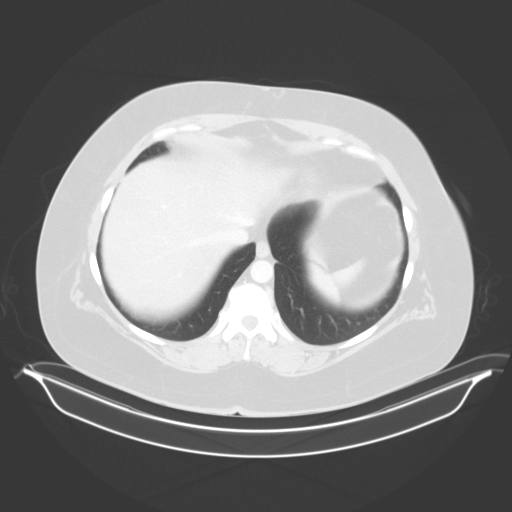
[im 94/101  soft-tissue]
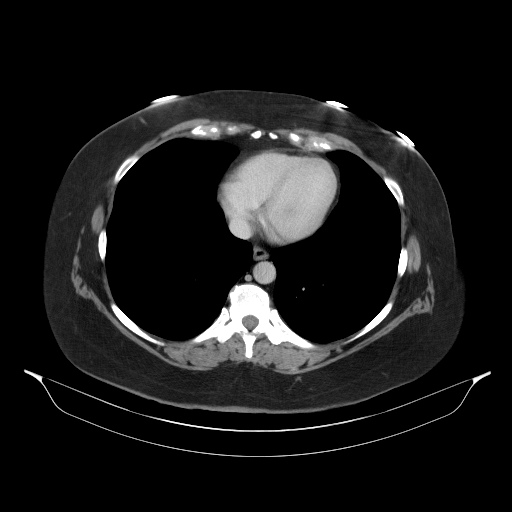
[im 94/101  lung]
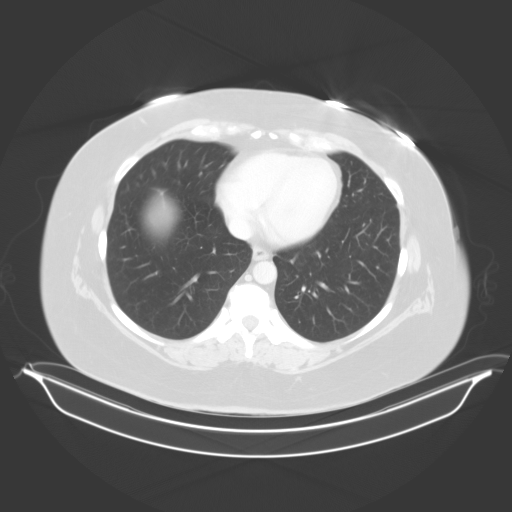

[13 of 32 positions shown; findings below may reference images not displayed]

FINDINGS: Lower chest: The lung bases are clear. No focal airspace disease,
pleural effusion, or pulmonary nodule. The heart is normal in size.

Hepatobiliary: Diffusely decreased hepatic density consistent with
steatosis. There is no focal hepatic lesion. Postcholecystectomy
without biliary dilatation. No visualized choledocholithiasis.

Pancreas: No ductal dilatation or inflammation. No evidence of
pancreatic mass.

Spleen: Normal in size without focal abnormality. Greatest splenic
dimension 9.9 cm.

Adrenals/Urinary Tract: Normal adrenal glands. No hydronephrosis or
perinephric edema. Homogeneous renal enhancement with symmetric
excretion on delayed phase imaging. Two nonobstructing stones in the
lower left kidney, largest 4 mm. There is a single punctate
nonobstructing stone in the lower right kidney. Two small
subcentimeter hypodensities in the lower left kidney are too small
to characterize but typically cysts. No evidence of solid renal
lesion. Urinary bladder is partially distended without wall
thickening.

Stomach/Bowel: Stomach is unremarkable. Normal positioning of the
duodenum and ligament of Treitz. Normal small bowel without
obstruction, wall thickening, or inflammation. Normal appendix.
Administered enteric contrast reaches the descending colon. There is
a small volume of colonic stool. No colonic wall thickening or
pericolonic edema. Occasional descending colonic diverticula, with
moderate sigmoid diverticulosis. No evidence of diverticulitis or
colonic wall thickening. No visualized colonic mass.

Vascular/Lymphatic: Moderate aortic atherosclerosis. No aortic
aneurysm. Patent portal vein. No acute vascular findings. No
enlarged lymph nodes in the abdomen or pelvis.

Reproductive: Uterus is unremarkable in CT appearance. Ovaries are
quiescent. There is no adnexal mass.

Other: No ascites or free fluid.  No abdominal wall hernia.

Musculoskeletal: Mild degenerative change in the lower lumbar spine.
There are no acute or suspicious osseous abnormalities.
IMPRESSION: 1. No acute abnormality or explanation for abdominal pain.
2. Hepatic steatosis.
3. Nonobstructing bilateral nephrolithiasis.
4. Colonic diverticulosis without diverticulitis.

Aortic Atherosclerosis (ILNV9-Q58.8).

## 2021-10-13 ENCOUNTER — Other Ambulatory Visit: Payer: Self-pay | Admitting: Family Medicine

## 2021-10-13 DIAGNOSIS — R0600 Dyspnea, unspecified: Secondary | ICD-10-CM

## 2021-10-14 ENCOUNTER — Other Ambulatory Visit: Payer: Self-pay

## 2021-10-14 ENCOUNTER — Ambulatory Visit (INDEPENDENT_AMBULATORY_CARE_PROVIDER_SITE_OTHER): Payer: 59 | Admitting: Internal Medicine

## 2021-10-14 DIAGNOSIS — R0600 Dyspnea, unspecified: Secondary | ICD-10-CM

## 2021-10-14 LAB — PULMONARY FUNCTION TEST
DL/VA % pred: 99 %
DL/VA: 4.24 ml/min/mmHg/L
DLCO cor % pred: 91 %
DLCO cor: 18.91 ml/min/mmHg
DLCO unc % pred: 91 %
DLCO unc: 18.91 ml/min/mmHg
FEF 25-75 Post: 1.84 L/sec
FEF 25-75 Pre: 1.01 L/sec
FEF2575-%Change-Post: 82 %
FEF2575-%Pred-Post: 70 %
FEF2575-%Pred-Pre: 38 %
FEV1-%Change-Post: 19 %
FEV1-%Pred-Post: 73 %
FEV1-%Pred-Pre: 61 %
FEV1-Post: 2 L
FEV1-Pre: 1.67 L
FEV1FVC-%Change-Post: 6 %
FEV1FVC-%Pred-Pre: 85 %
FEV6-%Change-Post: 13 %
FEV6-%Pred-Post: 82 %
FEV6-%Pred-Pre: 72 %
FEV6-Post: 2.77 L
FEV6-Pre: 2.44 L
FEV6FVC-%Change-Post: 0 %
FEV6FVC-%Pred-Post: 103 %
FEV6FVC-%Pred-Pre: 102 %
FVC-%Change-Post: 12 %
FVC-%Pred-Post: 80 %
FVC-%Pred-Pre: 71 %
FVC-Post: 2.77 L
FVC-Pre: 2.47 L
Post FEV1/FVC ratio: 72 %
Post FEV6/FVC ratio: 100 %
Pre FEV1/FVC ratio: 68 %
Pre FEV6/FVC Ratio: 99 %
RV % pred: 171 %
RV: 3.2 L
TLC % pred: 115 %
TLC: 5.86 L

## 2021-10-14 NOTE — Progress Notes (Signed)
PFT done today. 

## 2021-10-28 ENCOUNTER — Ambulatory Visit: Payer: Self-pay | Admitting: Neurology

## 2021-10-28 ENCOUNTER — Encounter: Payer: Self-pay | Admitting: Neurology

## 2022-02-18 ENCOUNTER — Other Ambulatory Visit: Payer: Self-pay | Admitting: Family Medicine

## 2022-02-18 DIAGNOSIS — I8001 Phlebitis and thrombophlebitis of superficial vessels of right lower extremity: Secondary | ICD-10-CM

## 2022-10-28 ENCOUNTER — Other Ambulatory Visit: Payer: Self-pay | Admitting: Family Medicine

## 2022-10-28 DIAGNOSIS — E049 Nontoxic goiter, unspecified: Secondary | ICD-10-CM

## 2022-10-30 ENCOUNTER — Encounter (HOSPITAL_BASED_OUTPATIENT_CLINIC_OR_DEPARTMENT_OTHER): Payer: Self-pay | Admitting: Certified Registered"

## 2022-10-30 ENCOUNTER — Encounter (HOSPITAL_BASED_OUTPATIENT_CLINIC_OR_DEPARTMENT_OTHER): Payer: Self-pay | Admitting: Urology

## 2022-10-30 NOTE — Progress Notes (Signed)
Pre-ESL instructions given to patient.  Her Daughter will be her caregiver.  Patient instructed to not smoke 6 hour prior to procedure.

## 2022-11-02 ENCOUNTER — Ambulatory Visit (HOSPITAL_BASED_OUTPATIENT_CLINIC_OR_DEPARTMENT_OTHER)
Admission: RE | Admit: 2022-11-02 | Discharge: 2022-11-02 | Disposition: A | Discharge status: Home / Self Care | Payer: Managed Care, Other (non HMO) | Attending: Urology | Admitting: Urology

## 2022-11-02 ENCOUNTER — Ambulatory Visit (HOSPITAL_COMMUNITY): Payer: Managed Care, Other (non HMO)

## 2022-11-02 ENCOUNTER — Encounter (HOSPITAL_BASED_OUTPATIENT_CLINIC_OR_DEPARTMENT_OTHER)
Admission: RE | Disposition: A | Discharge status: Home / Self Care | Payer: Self-pay | Source: Home / Self Care | Attending: Urology

## 2022-11-02 ENCOUNTER — Other Ambulatory Visit: Payer: Self-pay

## 2022-11-02 ENCOUNTER — Encounter (HOSPITAL_BASED_OUTPATIENT_CLINIC_OR_DEPARTMENT_OTHER): Payer: Self-pay | Admitting: Urology

## 2022-11-02 DIAGNOSIS — E669 Obesity, unspecified: Secondary | ICD-10-CM | POA: Insufficient documentation

## 2022-11-02 DIAGNOSIS — N200 Calculus of kidney: Secondary | ICD-10-CM | POA: Insufficient documentation

## 2022-11-02 DIAGNOSIS — I10 Essential (primary) hypertension: Secondary | ICD-10-CM | POA: Insufficient documentation

## 2022-11-02 DIAGNOSIS — F17200 Nicotine dependence, unspecified, uncomplicated: Secondary | ICD-10-CM | POA: Diagnosis not present

## 2022-11-02 DIAGNOSIS — K759 Inflammatory liver disease, unspecified: Secondary | ICD-10-CM | POA: Insufficient documentation

## 2022-11-02 DIAGNOSIS — Z6833 Body mass index (BMI) 33.0-33.9, adult: Secondary | ICD-10-CM | POA: Insufficient documentation

## 2022-11-02 HISTORY — PX: EXTRACORPOREAL SHOCK WAVE LITHOTRIPSY: SHX1557

## 2022-11-02 HISTORY — DX: Chronic obstructive pulmonary disease, unspecified: J44.9

## 2022-11-02 LAB — GLUCOSE, CAPILLARY
Glucose-Capillary: 191 mg/dL — ABNORMAL HIGH (ref 70–99)
Glucose-Capillary: 148 mg/dL — ABNORMAL HIGH (ref 70–99)

## 2022-11-02 SURGERY — LITHOTRIPSY, ESWL
Anesthesia: LOCAL | Laterality: Left

## 2022-11-02 MED ORDER — DIAZEPAM 5 MG PO TABS
10.0000 mg | ORAL_TABLET | ORAL | Status: AC
Start: 2022-11-02 — End: 2022-11-02
  Administered 2022-11-02: 10 mg via ORAL

## 2022-11-02 MED ORDER — LEVOFLOXACIN IN D5W 500 MG/100ML IV SOLN
INTRAVENOUS | Status: AC
  Filled 2022-11-02: qty 100

## 2022-11-02 MED ORDER — DIPHENHYDRAMINE HCL 25 MG PO CAPS
25.0000 mg | ORAL_CAPSULE | ORAL | Status: AC
  Administered 2022-11-02: 25 mg via ORAL

## 2022-11-02 MED ORDER — DIAZEPAM 5 MG PO TABS
ORAL_TABLET | ORAL | Status: AC
  Filled 2022-11-02: qty 2

## 2022-11-02 MED ORDER — DIPHENHYDRAMINE HCL 25 MG PO CAPS
ORAL_CAPSULE | ORAL | Status: AC
  Filled 2022-11-02: qty 1

## 2022-11-02 MED ORDER — SODIUM CHLORIDE 0.9 % IV SOLN
INTRAVENOUS | Status: DC
Start: 2022-11-02 — End: 2022-11-02

## 2022-11-02 MED ORDER — LEVOFLOXACIN IN D5W 500 MG/100ML IV SOLN
500.0000 mg | INTRAVENOUS | Status: AC
  Administered 2022-11-02: 500 mg via INTRAVENOUS

## 2022-11-02 NOTE — Discharge Instructions (Signed)
  Post Anesthesia Home Care Instructions  Activity: Get plenty of rest for the remainder of the day. A responsible individual must stay with you for 24 hours following the procedure.  For the next 24 hours, DO NOT: -Drive a car -Paediatric nurse -Drink alcoholic beverages -Take any medication unless instructed by your physician -Make any legal decisions or sign important papers.  Meals: Start with liquid foods such as gelatin or soup. Progress to regular foods as tolerated. Avoid greasy, spicy, heavy foods. If nausea and/or vomiting occur, drink only clear liquids until the nausea and/or vomiting subsides. Call your physician if vomiting continues.

## 2022-11-02 NOTE — Progress Notes (Signed)
Spoke with nurse from [facility] on [address] truck. Reported blood sugar of 191, pt has held metformin due to kidney stone. Pt also smoked a cigarette this morning between 0330-0400. OK to proceed.

## 2022-11-03 ENCOUNTER — Encounter (HOSPITAL_BASED_OUTPATIENT_CLINIC_OR_DEPARTMENT_OTHER): Payer: Self-pay | Admitting: Urology

## 2022-11-24 ENCOUNTER — Ambulatory Visit
Admission: RE | Admit: 2022-11-24 | Discharge: 2022-11-24 | Disposition: A | Discharge status: Home / Self Care | Payer: Managed Care, Other (non HMO) | Source: Ambulatory Visit | Attending: Family Medicine | Admitting: Family Medicine

## 2022-11-24 DIAGNOSIS — E049 Nontoxic goiter, unspecified: Secondary | ICD-10-CM
